# Patient Record
Sex: Male | Born: 1990 | Race: White | Hispanic: No | Marital: Single | Smoking: Never smoker
Health system: Southern US, Community
[De-identification: ages and names within clinical notes are randomized; demographics above are authoritative.]

---

## 2017-11-14 ENCOUNTER — Emergency Department (HOSPITAL_COMMUNITY)
Admission: EM | Admit: 2017-11-14 | Discharge: 2017-11-15 | Disposition: A | Discharge status: Home / Self Care | Payer: Self-pay | Attending: Emergency Medicine | Admitting: Emergency Medicine

## 2017-11-14 ENCOUNTER — Other Ambulatory Visit: Payer: Self-pay

## 2017-11-14 ENCOUNTER — Emergency Department (HOSPITAL_COMMUNITY): Payer: Self-pay

## 2017-11-14 ENCOUNTER — Encounter (HOSPITAL_COMMUNITY): Payer: Self-pay | Admitting: *Deleted

## 2017-11-14 DIAGNOSIS — Y9374 Activity, frisbee: Secondary | ICD-10-CM | POA: Insufficient documentation

## 2017-11-14 DIAGNOSIS — Y998 Other external cause status: Secondary | ICD-10-CM | POA: Insufficient documentation

## 2017-11-14 DIAGNOSIS — S53105A Unspecified dislocation of left ulnohumeral joint, initial encounter: Secondary | ICD-10-CM | POA: Insufficient documentation

## 2017-11-14 DIAGNOSIS — R52 Pain, unspecified: Secondary | ICD-10-CM

## 2017-11-14 DIAGNOSIS — Y929 Unspecified place or not applicable: Secondary | ICD-10-CM | POA: Insufficient documentation

## 2017-11-14 DIAGNOSIS — W0110XA Fall on same level from slipping, tripping and stumbling with subsequent striking against unspecified object, initial encounter: Secondary | ICD-10-CM | POA: Insufficient documentation

## 2017-11-14 LAB — CBC WITH DIFFERENTIAL/PLATELET
Basophils Absolute: 0 10*3/uL (ref 0.0–0.1)
Basophils Relative: 0 %
Eosinophils Absolute: 0.1 10*3/uL (ref 0.0–0.7)
Eosinophils Relative: 1 %
HCT: 45.7 % (ref 39.0–52.0)
Hemoglobin: 15.4 g/dL (ref 13.0–17.0)
Lymphocytes Relative: 9 %
Lymphs Abs: 1.2 10*3/uL (ref 0.7–4.0)
MCH: 30.4 pg (ref 26.0–34.0)
MCHC: 33.7 g/dL (ref 30.0–36.0)
MCV: 90.1 fL (ref 78.0–100.0)
Monocytes Absolute: 0.8 10*3/uL (ref 0.1–1.0)
Monocytes Relative: 6 %
Neutro Abs: 11.2 10*3/uL — ABNORMAL HIGH (ref 1.7–7.7)
Neutrophils Relative %: 84 %
Platelets: 212 10*3/uL (ref 150–400)
RBC: 5.07 MIL/uL (ref 4.22–5.81)
RDW: 12.6 % (ref 11.5–15.5)
WBC: 13.3 10*3/uL — ABNORMAL HIGH (ref 4.0–10.5)

## 2017-11-14 LAB — BASIC METABOLIC PANEL
Anion gap: 12 (ref 5–15)
BUN: 23 mg/dL — ABNORMAL HIGH (ref 6–20)
CO2: 26 mmol/L (ref 22–32)
Calcium: 9.9 mg/dL (ref 8.9–10.3)
Chloride: 104 mmol/L (ref 98–111)
Creatinine, Ser: 1.35 mg/dL — ABNORMAL HIGH (ref 0.61–1.24)
GFR calc Af Amer: >60 mL/min (ref >60)
GFR calc non Af Amer: >60 mL/min (ref >60)
Glucose, Bld: 96 mg/dL (ref 70–99)
Potassium: 4.3 mmol/L (ref 3.5–5.1)
Sodium: 142 mmol/L (ref 135–145)

## 2017-11-14 MED ORDER — IBUPROFEN 600 MG PO TABS: 600.0000 mg | ORAL_TABLET | Freq: Four times a day (QID) | ORAL | 0 refills | Status: AC | PRN

## 2017-11-14 MED ORDER — HYDROCODONE-ACETAMINOPHEN 5-325 MG PO TABS: 1.0000 | ORAL_TABLET | Freq: Four times a day (QID) | ORAL | 0 refills | Status: AC | PRN

## 2017-11-14 MED ORDER — PROPOFOL 10 MG/ML IV BOLUS
1.0000 mg/kg | Freq: Once | INTRAVENOUS | Status: AC
  Administered 2017-11-14: 83.9 mg via INTRAVENOUS
  Filled 2017-11-14: qty 20

## 2017-11-14 MED ORDER — KETAMINE HCL 50 MG/5ML IJ SOSY
1.0000 mg/kg | PREFILLED_SYRINGE | Freq: Once | INTRAMUSCULAR | Status: DC
  Filled 2017-11-14: qty 10

## 2017-11-14 MED ORDER — MORPHINE SULFATE (PF) 4 MG/ML IV SOLN
4.0000 mg | Freq: Once | INTRAVENOUS | Status: AC
  Administered 2017-11-14: 4 mg via INTRAVENOUS
  Filled 2017-11-14: qty 1

## 2017-11-14 NOTE — ED Provider Notes (Signed)
[facility]-EMERGENCY DEPT Provider Note   CSN: [id number] Arrival date & time: 11/14/17  1937     History   Chief Complaint Chief Complaint  Patient presents with  . Elbow Pain    HPI Chase Barry is a 27 y.o. male who is previously healthy who presents with left elbow pain after falling while playing Frisbee tonight.  He reports falling on outstretched hand.  He denies any numbness or tingling.  He is able to move his fingers.  He has no pain in his hand or wrist or shoulder.  No medications taken prior to arrival.  He did not hit his head or lose consciousness.  He is left-handed.  HPI  History reviewed. No pertinent past medical history.  There are no active problems to display for this patient.   History reviewed. No pertinent surgical history.      Home Medications    Prior to Admission medications   Medication Sig Start Date End Date Taking? Authorizing Provider  HYDROcodone-acetaminophen (NORCO/VICODIN) 5-325 MG tablet Take 1-2 tablets by mouth every 6 (six) hours as needed. 11/14/17   Ebrima Ranta, Waylan Boga, PA-C  ibuprofen (ADVIL,MOTRIN) 600 MG tablet Take 1 tablet (600 mg total) by mouth every 6 (six) hours as needed. 11/14/17   Emi Holes, PA-C    Family History No family history on file.  Social History Social History   Tobacco Use  . Smoking status: Never Smoker  . Smokeless tobacco: Never Used  Substance Use Topics  . Alcohol use: Yes  . Drug use: Never     Allergies   Pollen extract   Review of Systems Review of Systems  Constitutional: Negative for chills and fever.  HENT: Negative for facial swelling and sore throat.   Respiratory: Negative for shortness of breath.   Cardiovascular: Negative for chest pain.  Gastrointestinal: Negative for abdominal pain, nausea and vomiting.  Genitourinary: Negative for dysuria.  Musculoskeletal: Positive for arthralgias. Negative for back pain.  Skin: Negative for rash and wound.    Neurological: Negative for numbness and headaches.  Psychiatric/Behavioral: The patient is not nervous/anxious.      Physical Exam Updated Vital Signs BP 138/73   Pulse 81   Temp 98.8 F (37.1 C) (Oral)   Resp 19   Ht 6\' 4"  (1.93 m)   Wt 83.9 kg   SpO2 100%   BMI 22.52 kg/m   Physical Exam  Constitutional: He appears well-developed and well-nourished. No distress.  HENT:  Head: Normocephalic and atraumatic.  Mouth/Throat: Oropharynx is clear and moist. No oropharyngeal exudate.  Eyes: Pupils are equal, round, and reactive to light. Conjunctivae are normal. Right eye exhibits no discharge. Left eye exhibits no discharge. No scleral icterus.  Neck: Normal range of motion. Neck supple. No thyromegaly present.  Cardiovascular: Normal rate, regular rhythm, normal heart sounds and intact distal pulses. Exam reveals no gallop and no friction rub.  No murmur heard. Pulmonary/Chest: Effort normal and breath sounds normal. No stridor. No respiratory distress. He has no wheezes. He has no rales.  Abdominal: Soft. Bowel sounds are normal. He exhibits no distension. There is no tenderness. There is no rebound and no guarding.  Musculoskeletal: He exhibits no edema.  Tenderness and deformity noted to left elbow; FROM of all digits on the L, radial pulses intact, sensation intact  Lymphadenopathy:    He has no cervical adenopathy.  Neurological: He is alert. Coordination normal.  Skin: Skin is warm and dry. No rash noted. He  is not diaphoretic. No pallor.  Psychiatric: He has a normal mood and affect.  Nursing note and vitals reviewed.    ED Treatments / Results  Labs (all labs ordered are listed, but only abnormal results are displayed) Labs Reviewed  BASIC METABOLIC PANEL - Abnormal; Notable for the following components:      Result Value   BUN 23 (*)    Creatinine, Ser 1.35 (*)    All other components within normal limits  CBC WITH DIFFERENTIAL/PLATELET - Abnormal; Notable for  the following components:   WBC 13.3 (*)    Neutro Abs 11.2 (*)    All other components within normal limits    EKG None  Radiology Dg Elbow 2 Views Left  Result Date: 11/14/2017 CLINICAL DATA:  Post reduction EXAM: LEFT ELBOW - 2 VIEW COMPARISON:  11/14/2017 FINDINGS: Interval casting of the elbow. Reduction of posterior elbow dislocation. Elbow effusion is present. Suspected small fracture fragments along the posterior cortical surface of the humerus. IMPRESSION: Reduction of elbow dislocation with small fracture fragments adjacent to the posterior, distal aspect of the humerus. Electronically Signed   By: Jasmine Pang M.D.   On: 11/14/2017 23:26   Dg Elbow Complete Left  Result Date: 11/14/2017 CLINICAL DATA:  Fall on elbow.  Deformity. EXAM: LEFT ELBOW - COMPLETE 3+ VIEW COMPARISON:  None. FINDINGS: There is posterior dislocation at the left elbow with the radius and ulna projecting posterior to the humerus. Small avulsed fracture fragments noted. IMPRESSION: Posterior dislocation at the left elbow as above. Small avulsed fragments. Electronically Signed   By: Charlett Nose M.D.   On: 11/14/2017 20:32    Procedures .Sedation Date/Time: 11/14/2017 11:18 PM Performed by: Emi Holes, PA-C Authorized by: Emi Holes, PA-C   Consent:    Consent obtained:  Verbal   Consent given by:  Patient   Risks discussed:  Allergic reaction, dysrhythmia, inadequate sedation, nausea, prolonged hypoxia resulting in organ damage, prolonged sedation necessitating reversal, respiratory compromise necessitating ventilatory assistance and intubation and vomiting   Alternatives discussed:  Analgesia without sedation, anxiolysis and regional anesthesia Universal protocol:    Procedure explained and questions answered to patient or proxy's satisfaction: yes     Relevant documents present and verified: yes     Test results available and properly labeled: yes     Imaging studies available: yes      Required blood products, implants, devices, and special equipment available: yes     Site/side marked: yes     Immediately prior to procedure a time out was called: yes     Patient identity confirmation method:  Verbally with patient Indications:    Procedure necessitating sedation performed by:  Physician performing sedation   Intended level of sedation:  Deep Pre-sedation assessment:    Time since last food or drink:  1200   ASA classification: class 1 - normal, healthy patient     Neck mobility: normal     Mouth opening:  3 or more finger widths   Thyromental distance:  4 finger widths   Mallampati score:  I - soft palate, uvula, fauces, pillars visible   Pre-sedation assessments completed and reviewed: airway patency, cardiovascular function, hydration status, mental status, nausea/vomiting, pain level, respiratory function and temperature   Immediate pre-procedure details:    Reassessment: Patient reassessed immediately prior to procedure     Reviewed: vital signs, relevant labs/tests and NPO status     Verified: bag valve mask available, emergency equipment available, intubation  equipment available, IV patency confirmed, oxygen available and suction available   Procedure details (see MAR for exact dosages):    Preoxygenation:  Nasal cannula   Sedation:  Propofol   Intra-procedure monitoring:  Blood pressure monitoring, cardiac monitor, continuous pulse oximetry, frequent LOC assessments, frequent vital sign checks and continuous capnometry   Intra-procedure events: none     Total Provider sedation time (minutes):  5 Post-procedure details:    Attendance: Constant attendance by certified staff until patient recovered     Recovery: Patient returned to pre-procedure baseline     Post-sedation assessments completed and reviewed: airway patency, cardiovascular function, hydration status, mental status, nausea/vomiting, pain level, respiratory function and temperature     Patient is  stable for discharge or admission: yes     Patient tolerance:  Tolerated well, no immediate complications Comments:     Dr. Pilar Plate present and overseeing care Reduction of dislocation Date/Time: 11/14/2017 11:19 PM Performed by: Emi Holes, PA-C Authorized by: Emi Holes, PA-C  Consent: Verbal consent obtained. Consent given by: patient Patient understanding: patient states understanding of the procedure being performed Patient consent: the patient's understanding of the procedure matches consent given Procedure consent: procedure consent matches procedure scheduled Relevant documents: relevant documents present and verified Test results: test results available and properly labeled Site marked: the operative site was marked Imaging studies: imaging studies available Required items: required blood products, implants, devices, and special equipment available Patient identity confirmed: verbally with patient Time out: Immediately prior to procedure a "time out" was called to verify the correct patient, procedure, equipment, support staff and site/side marked as required. Local anesthesia used: no  Anesthesia: Local anesthesia used: no  Sedation: Patient sedated: yes Sedatives: propofol Vitals: Vital signs were monitored during sedation.  Patient tolerance: Patient tolerated the procedure well with no immediate complications Comments: Left Elbow dislocation reduction with counter traction and extension of the L elbow; successful attempt x 1   CRITICAL CARE Performed by: Emi Holes   Total critical care time:  30 minutes  Critical care time was exclusive of separately billable procedures and treating other patients.  Critical care was necessary to treat or prevent imminent or life-threatening deterioration.  Critical care was time spent personally by me on the following activities: development of treatment plan with patient and/or surrogate as well as nursing,  discussions with consultants, evaluation of patient's response to treatment, examination of patient, obtaining history from patient or surrogate, ordering and performing treatments and interventions, ordering and review of laboratory studies, ordering and review of radiographic studies, pulse oximetry and re-evaluation of patient's condition.    (including critical care time)  SPLINT APPLICATION Date/Time: 11:30 PM Authorized by: Emi Holes Consent: Verbal consent obtained. Risks and benefits: risks, benefits and alternatives were discussed Consent given by: patient Splint applied by: orthopedic technician Location details: L elbow Splint type: Posterior splint Supplies used: orthoglass, ACE, arm sling Post-procedure: The splinted body part was neurovascularly unchanged following the procedure. Patient tolerance: Patient tolerated the procedure well with no immediate complications.     Medications Ordered in ED Medications  ketamine 50 mg in normal saline 5 mL (10 mg/mL) syringe ( Intravenous Canceled Entry 11/14/17 2322)  morphine 4 MG/ML injection 4 mg (4 mg Intravenous Given 11/14/17 2133)  propofol (DIPRIVAN) 10 mg/mL bolus/IV push 83.9 mg (83.9 mg Intravenous Given 11/14/17 2259)     Initial Impression / Assessment and Plan / ED Course  I have reviewed the triage vital signs and  the nursing notes.  Pertinent labs & imaging results that were available during my care of the patient were reviewed by me and considered in my medical decision making (see chart for details).     Patient presenting with left elbow dislocation after falling on outstretched hand.  Patient case with orthopedist on-call, Dr. Lequita Halt, who advised reduction and posterior splint with follow-up in the office this week.  Procedure performed under conscious sedation with propofol.  With countertraction and extension of the left elbow, reduction felt and returned.  Patient placed in posterior splint.  Reduction  x-ray shows anatomical position.  Patient returned to baseline and is feeling well prior to discharge.  Will discharge home with short course of Norco in addition to ibuprofen.  I reviewed the [place] narcotic database and found no discrepancies.  My attending, Dr. Pilar Plate, was present for procedure and sedation, evaluated patient, and guided the patient's management.  Final Clinical Impressions(s) / ED Diagnoses   Final diagnoses:  Dislocation of left elbow, initial encounter    ED Discharge Orders         Ordered    ibuprofen (ADVIL,MOTRIN) 600 MG tablet  Every 6 hours PRN     11/14/17 2316    HYDROcodone-acetaminophen (NORCO/VICODIN) 5-325 MG tablet  Every 6 hours PRN     11/14/17 2316           Emi Holes, PA-C 11/14/17 2336    Sabas Sous, MD 11/15/17 [phone]

## 2017-11-14 NOTE — Sedation Documentation (Signed)
Portable xray performed

## 2017-11-14 NOTE — Discharge Instructions (Addendum)
Keep your splint applied, clean, and dry until evaluated by orthopedics.  Wear sling at all times except when bathing. Range your shoulder several times daily.  Take ibuprofen every 6 hours as needed for pain.  For breakthrough pain, take 1-2 Norco every 6 hours.  Do not drive or operate machinery while taking this medication.  Use ice to your elbow through the splint 3-4 times daily alternating 20 minutes on, 20 minutes off.  This will help with swelling.  Please follow-up with the orthopedic doctor below for further evaluation and treatment.  You will need to call tomorrow to make an appointment.  Please return the emergency department if you develop any new or worsening symptoms including numbness of your fingers, color change in your fingers, severe worsening pain not improved by medication, or if your splint gets wet.  Do not drink alcohol, drive, operate machinery or participate in any other potentially dangerous activities while taking opiate pain medication as it may make you sleepy. Do not take this medication with any other sedating medications, either prescription or over-the-counter. If you were prescribed Percocet or Vicodin, do not take these with acetaminophen (Tylenol) as it is already contained within these medications and overdose of Tylenol is dangerous.   This medication is an opiate (or narcotic) pain medication and can be habit forming.  Use it as little as possible to achieve adequate pain control.  Do not use or use it with extreme caution if you have a history of opiate abuse or dependence. This medication is intended for your use only - do not give any to anyone else and keep it in a secure place where nobody else, especially children, have access to it. It will also cause or worsen constipation, so you may want to consider taking an over-the-counter stool softener while you are taking this medication.

## 2017-11-14 NOTE — Sedation Documentation (Signed)
Pain level 2-3/10. Patient is talking to staff and friend at bedside. Patient reports he feels close to baseline. Appears a little sleepy but not closing his eyes. Discontinued his oxygen.

## 2017-11-14 NOTE — Sedation Documentation (Signed)
Benedict, ortho tech, placing elbow brace.

## 2017-11-14 NOTE — Sedation Documentation (Signed)
Alex, PA placed joint back into place.

## 2017-11-14 NOTE — ED Triage Notes (Signed)
Pt was playing Frisbee and landed on his left elbow around 1915. No pain meds PTA.

## 2017-11-14 NOTE — Progress Notes (Signed)
RT in room for duration of conscious sedation procedure.  Pt remained stable throughout procedure.

## 2017-11-15 NOTE — ED Provider Notes (Signed)
.  Sedation Date/Time: 11/15/2017 12:13 AM Performed by: Sabas Sous, MD Authorized by: Sabas Sous, MD   Consent:    Consent obtained:  Written and verbal   Consent given by:  Patient Universal protocol:    Immediately prior to procedure a time out was called: yes   Indications:    Procedure performed:  Dislocation reduction   Procedure necessitating sedation performed by:  Physician performing sedation   Intended level of sedation:  Moderate (conscious sedation) Pre-sedation assessment:    Time since last food or drink:  1200   ASA classification: class 1 - normal, healthy patient     Mallampati score:  I - soft palate, uvula, fauces, pillars visible   Pre-sedation assessments completed and reviewed: airway patency   Immediate pre-procedure details:    Reviewed: vital signs     Verified: bag valve mask available, intubation equipment available, oxygen available and suction available   Procedure details (see MAR for exact dosages):    Preoxygenation:  Nasal cannula   Sedation:  Propofol   Intra-procedure monitoring:  Blood pressure monitoring and continuous capnometry   Total Provider sedation time (minutes):  15 Post-procedure details:    Patient tolerance:  Tolerated well, no immediate complications .Ortho Injury Treatment Date/Time: 11/15/2017 12:16 AM Performed by: Sabas Sous, MD Authorized by: Sabas Sous, MD   Consent:    Consent obtained:  Written   Consent given by:  Patient   Risks discussed:  Nerve damage, vascular damage and fracture   Alternatives discussed:  No treatmentInjury location: elbow Location details: left elbow Injury type: dislocation Dislocation type: posterior Pre-procedure neurovascular assessment: neurovascularly intact Pre-procedure distal perfusion: normal Pre-procedure neurological function: normal Pre-procedure range of motion: normal  Anesthesia: Local anesthesia used: no  Patient sedated: Yes. Refer to sedation  procedure documentation for details of sedation. Manipulation performed: yes Reduction method: direct traction Reduction successful: yes X-ray confirmed reduction: yes Immobilization: splint Splint type: long arm (posterior) Supplies used: Ortho-Glass Post-procedure neurovascular assessment: post-procedure neurovascularly intact Post-procedure distal perfusion: normal Post-procedure neurological function: normal Post-procedure range of motion: normal Patient tolerance: Patient tolerated the procedure well with no immediate complications   Elmer Sow. Pilar Plate, MD [facility] [email or web address]      Sabas Sous, MD 11/15/17 [phone]

## 2019-11-28 ENCOUNTER — Other Ambulatory Visit: Payer: Self-pay

## 2019-11-28 DIAGNOSIS — Z20822 Contact with and (suspected) exposure to covid-19: Secondary | ICD-10-CM

## 2019-11-30 LAB — NOVEL CORONAVIRUS, NAA: SARS-CoV-2, NAA: NOT DETECTED

## 2019-11-30 LAB — SARS-COV-2, NAA 2 DAY TAT

## 2019-11-30 LAB — SPECIMEN STATUS REPORT

## 2020-05-05 IMAGING — CR DG ELBOW COMPLETE 3+V*L*
3 series · 3 of 3 positions shown · non-contrast
Comparison: None.

CLINICAL DATA: Fall on elbow.  Deformity.

EXAM:
LEFT ELBOW - COMPLETE 3+ VIEW

[x elbow ap left]
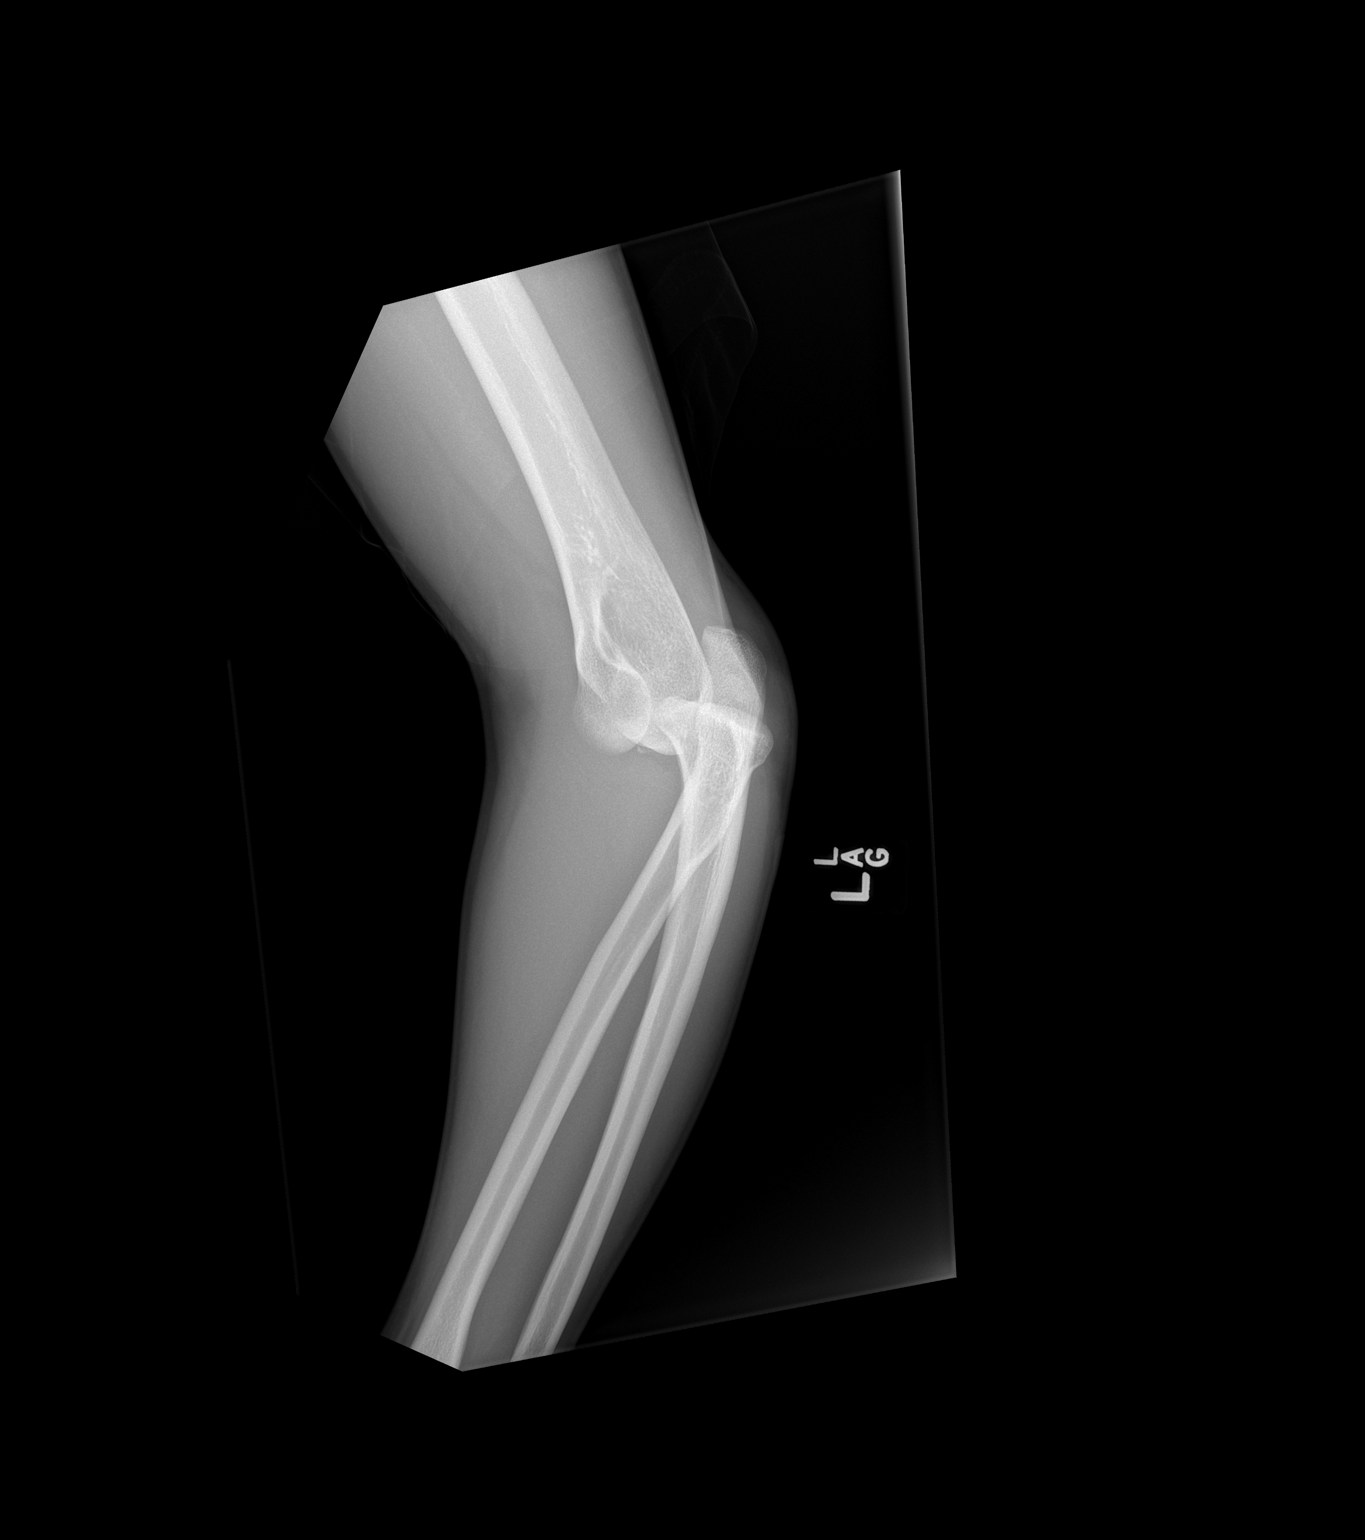

[x elbow obl left (1 of 2)]
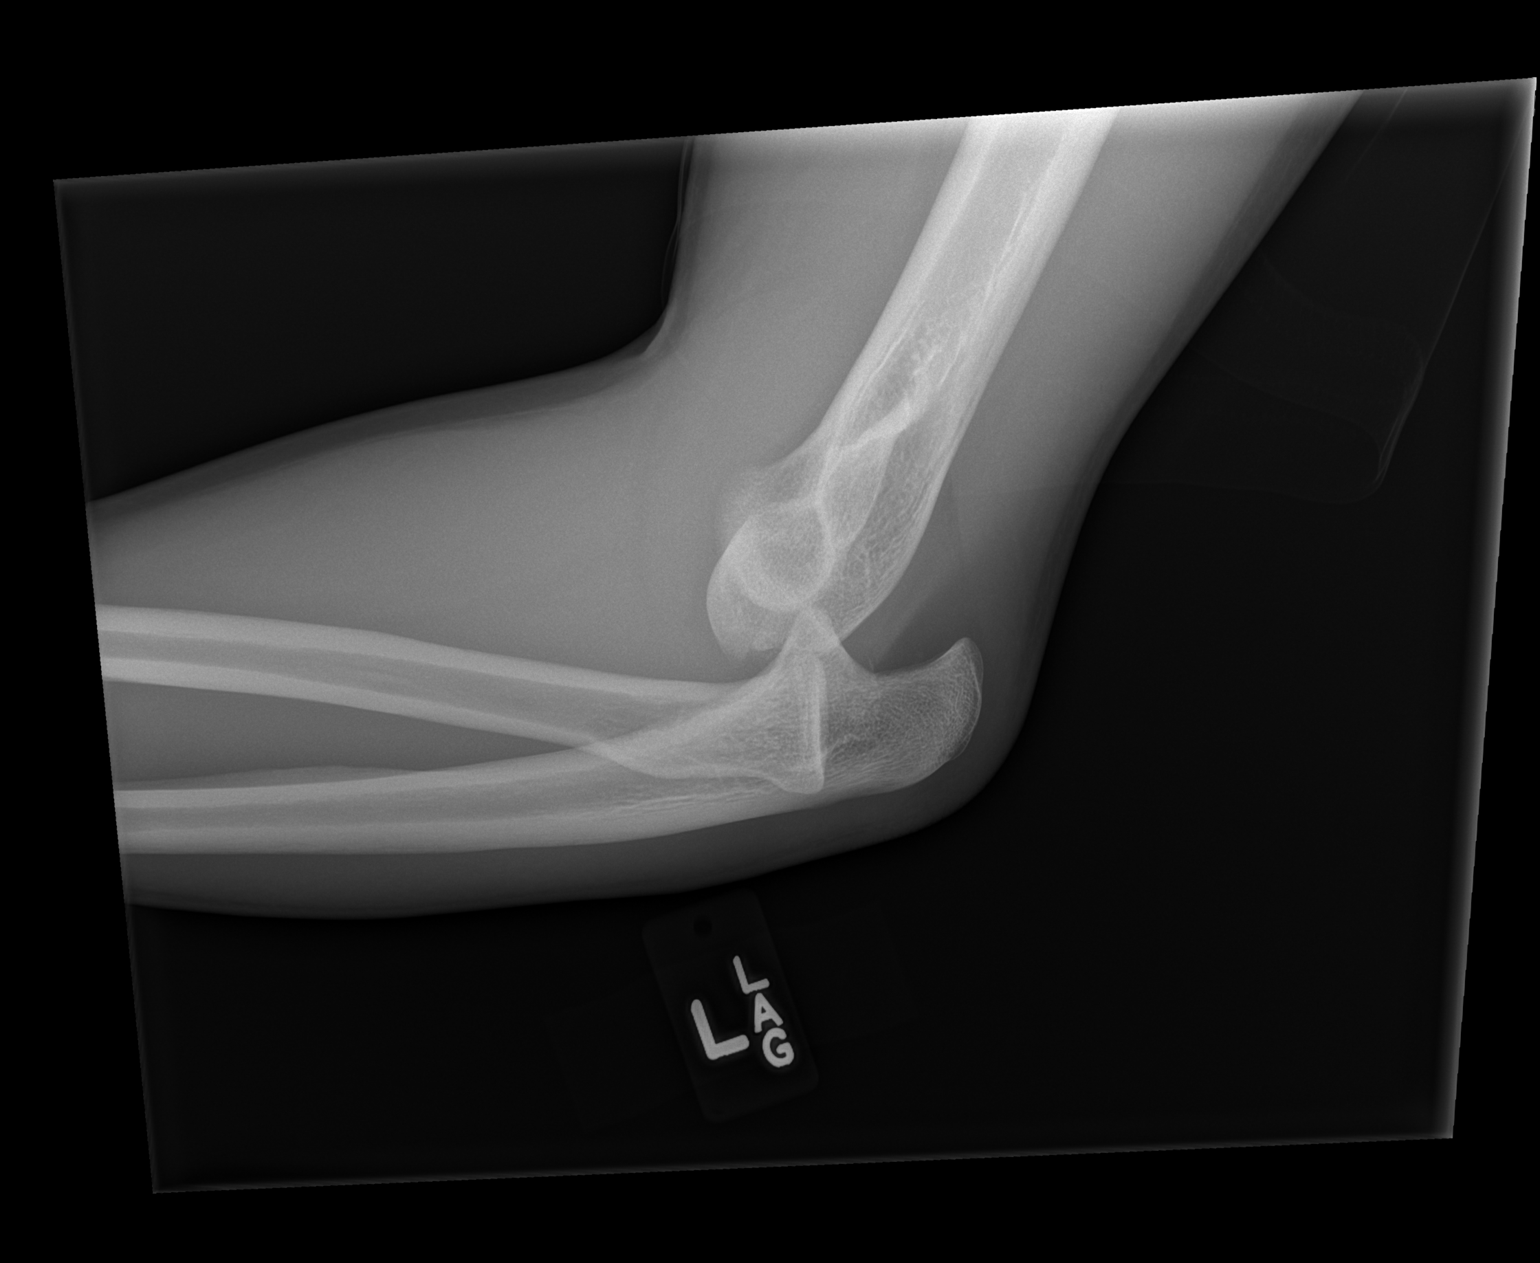

[x elbow obl left (2 of 2)]
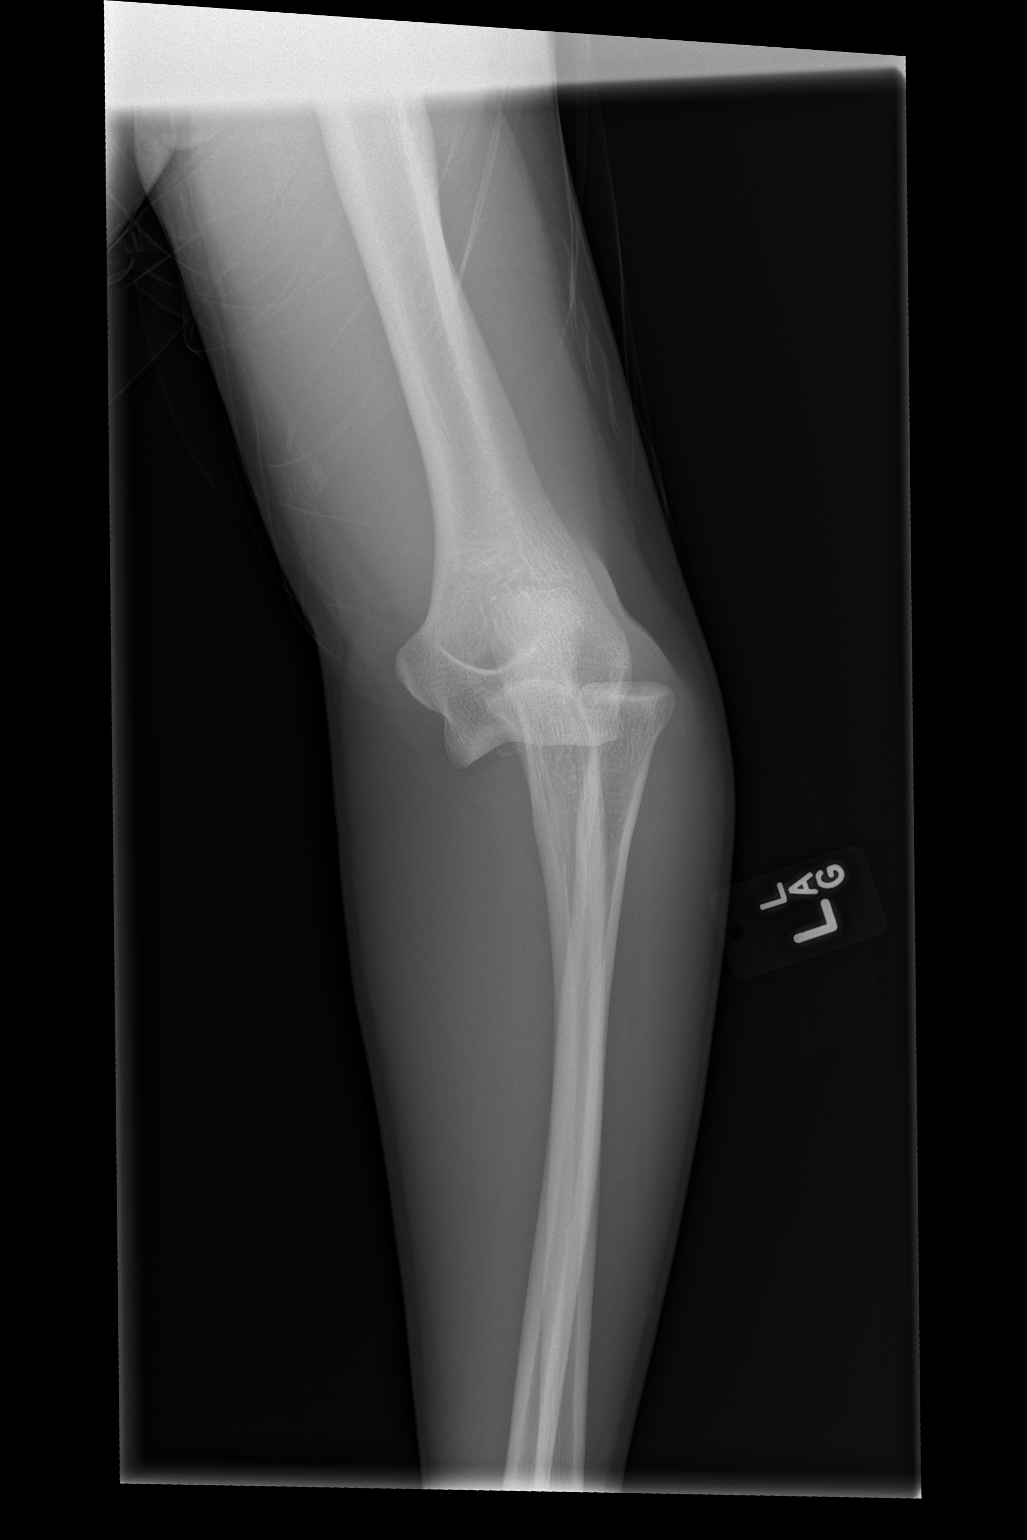

[3 of 3 positions shown; findings below may reference images not displayed]

FINDINGS: There is posterior dislocation at the left elbow with the radius and
ulna projecting posterior to the humerus. Small avulsed fracture
fragments noted.
IMPRESSION: Posterior dislocation at the left elbow as above. Small avulsed
fragments.

## 2020-10-29 DIAGNOSIS — S161XXA Strain of muscle, fascia and tendon at neck level, initial encounter: Secondary | ICD-10-CM | POA: Diagnosis not present

## 2022-08-31 DIAGNOSIS — S8391XA Sprain of unspecified site of right knee, initial encounter: Secondary | ICD-10-CM | POA: Diagnosis not present
